# Patient Record
Sex: Male | Born: 1970 | Race: White | Hispanic: No | Marital: Married | Smoking: Never smoker
Health system: Southern US, Community
[De-identification: ages and names within clinical notes are randomized; demographics above are authoritative.]

## PROBLEM LIST (undated history)

## (undated) DIAGNOSIS — E119 Type 2 diabetes mellitus without complications: Secondary | ICD-10-CM

---

## 2008-02-22 ENCOUNTER — Emergency Department (HOSPITAL_COMMUNITY): Admission: EM | Admit: 2008-02-22 | Discharge: 2008-02-22 | Payer: Self-pay | Admitting: Emergency Medicine

## 2011-04-20 ENCOUNTER — Emergency Department (HOSPITAL_BASED_OUTPATIENT_CLINIC_OR_DEPARTMENT_OTHER)
Admission: EM | Admit: 2011-04-20 | Discharge: 2011-04-20 | Disposition: A | Discharge status: Home / Self Care | Payer: BC Managed Care – PPO | Attending: Emergency Medicine | Admitting: Emergency Medicine

## 2011-04-20 ENCOUNTER — Emergency Department (INDEPENDENT_AMBULATORY_CARE_PROVIDER_SITE_OTHER): Payer: BC Managed Care – PPO

## 2011-04-20 DIAGNOSIS — L97509 Non-pressure chronic ulcer of other part of unspecified foot with unspecified severity: Secondary | ICD-10-CM | POA: Insufficient documentation

## 2011-04-20 DIAGNOSIS — M79609 Pain in unspecified limb: Secondary | ICD-10-CM

## 2011-04-20 DIAGNOSIS — E1069 Type 1 diabetes mellitus with other specified complication: Secondary | ICD-10-CM | POA: Insufficient documentation

## 2011-04-20 LAB — BASIC METABOLIC PANEL
CO2: 25 mEq/L (ref 19–32)
Calcium: 9.5 mg/dL (ref 8.4–10.5)
Chloride: 103 mEq/L (ref 96–112)
GFR calc Af Amer: >60 mL/min (ref >60)
Glucose, Bld: 323 mg/dL — ABNORMAL HIGH (ref 70–99)
Potassium: 4.7 mEq/L (ref 3.5–5.1)
Sodium: 142 mEq/L (ref 135–145)

## 2011-04-20 LAB — CBC
HCT: 40 % (ref 39.0–52.0)
Hemoglobin: 14.1 g/dL (ref 13.0–17.0)
MCH: 27.9 pg (ref 26.0–34.0)
MCHC: 35.3 g/dL (ref 30.0–36.0)
MCV: 79.1 fL (ref 78.0–100.0)
RBC: 5.06 MIL/uL (ref 4.22–5.81)

## 2011-04-20 LAB — DIFFERENTIAL
Lymphocytes Relative: 21 % (ref 12–46)
Lymphs Abs: 1.5 10*3/uL (ref 0.7–4.0)
Monocytes Absolute: 0.6 10*3/uL (ref 0.1–1.0)
Monocytes Relative: 8 % (ref 3–12)
Neutro Abs: 4.6 10*3/uL (ref 1.7–7.7)
Neutrophils Relative %: 66 % (ref 43–77)

## 2011-04-20 LAB — GLUCOSE, CAPILLARY: Glucose-Capillary: 368 mg/dL — ABNORMAL HIGH (ref 70–99)

## 2011-09-19 LAB — CBC
HCT: 51.3
Hemoglobin: 17.4 — ABNORMAL HIGH
MCHC: 34
RBC: 6.08 — ABNORMAL HIGH

## 2011-09-19 LAB — I-STAT 8, (EC8 V) (CONVERTED LAB)
Acid-base deficit: 3 — ABNORMAL HIGH
Bicarbonate: 22.5
Chloride: 102
HCT: 55 — ABNORMAL HIGH
Operator id: 277751
Sodium: 135
pCO2, Ven: 39.6 — ABNORMAL LOW

## 2011-09-19 LAB — DIFFERENTIAL
Basophils Relative: 0
Eosinophils Relative: 1
Monocytes Absolute: 0.4
Monocytes Relative: 4
Neutro Abs: 10.2 — ABNORMAL HIGH

## 2011-09-19 LAB — POCT CARDIAC MARKERS
CKMB, poc: <1 — ABNORMAL LOW
Troponin i, poc: <0.05

## 2011-09-19 LAB — POCT I-STAT CREATININE: Creatinine, Ser: 1.3

## 2011-09-19 LAB — URINALYSIS, ROUTINE W REFLEX MICROSCOPIC
Bilirubin Urine: NEGATIVE
Glucose, UA: >1000 — AB
Ketones, ur: >80 — AB
pH: 5.5

## 2011-09-19 LAB — URINE MICROSCOPIC-ADD ON

## 2012-11-21 ENCOUNTER — Emergency Department (HOSPITAL_COMMUNITY): Payer: BC Managed Care – PPO

## 2012-11-21 ENCOUNTER — Emergency Department (HOSPITAL_COMMUNITY)
Admission: EM | Admit: 2012-11-21 | Discharge: 2012-11-21 | Disposition: A | Discharge status: Home / Self Care | Payer: BC Managed Care – PPO | Attending: Emergency Medicine | Admitting: Emergency Medicine

## 2012-11-21 DIAGNOSIS — F411 Generalized anxiety disorder: Secondary | ICD-10-CM | POA: Insufficient documentation

## 2012-11-21 DIAGNOSIS — J3489 Other specified disorders of nose and nasal sinuses: Secondary | ICD-10-CM | POA: Insufficient documentation

## 2012-11-21 DIAGNOSIS — E119 Type 2 diabetes mellitus without complications: Secondary | ICD-10-CM | POA: Insufficient documentation

## 2012-11-21 DIAGNOSIS — R110 Nausea: Secondary | ICD-10-CM | POA: Insufficient documentation

## 2012-11-21 DIAGNOSIS — J069 Acute upper respiratory infection, unspecified: Secondary | ICD-10-CM | POA: Insufficient documentation

## 2012-11-21 DIAGNOSIS — Z794 Long term (current) use of insulin: Secondary | ICD-10-CM | POA: Insufficient documentation

## 2012-11-21 DIAGNOSIS — R509 Fever, unspecified: Secondary | ICD-10-CM | POA: Insufficient documentation

## 2012-11-21 LAB — GLUCOSE, CAPILLARY: Glucose-Capillary: 356 mg/dL — ABNORMAL HIGH (ref 70–99)

## 2012-11-21 MED ORDER — DEXTROMETHORPHAN POLISTIREX 30 MG/5ML PO LQCR
10.0000 mL | Freq: Once | ORAL | Status: DC
  Filled 2012-11-21: qty 10

## 2012-11-21 MED ORDER — ALBUTEROL SULFATE HFA 108 (90 BASE) MCG/ACT IN AERS: 1.0000 | INHALATION_SPRAY | Freq: Four times a day (QID) | RESPIRATORY_TRACT | Status: DC | PRN

## 2012-11-21 MED ORDER — ALBUTEROL SULFATE (5 MG/ML) 0.5% IN NEBU
2.5000 mg | INHALATION_SOLUTION | Freq: Once | RESPIRATORY_TRACT | Status: AC
  Administered 2012-11-21: 2.5 mg via RESPIRATORY_TRACT
  Filled 2012-11-21: qty 0.5

## 2012-11-21 MED ORDER — DEXTROMETHORPHAN HBR 15 MG/5ML PO SYRP: 10.0000 mL | ORAL_SOLUTION | Freq: Four times a day (QID) | ORAL | Status: DC | PRN

## 2012-11-21 NOTE — ED Provider Notes (Signed)
Medical screening examination/treatment/procedure(s) were performed by non-physician practitioner and as supervising physician I was immediately available for consultation/collaboration.  Sunnie Nielsen, MD 11/21/12 [phone]

## 2012-11-21 NOTE — ED Notes (Signed)
POCT CBG 356. RN Sport and exercise psychologist notified

## 2012-11-21 NOTE — ED Notes (Signed)
Pt from home, alert and oriented with c/o shob and having to "think to breath." Patient sts hx of anxiety attacks- sts this feels similar. Patient sts that he is having no chest pain, but has had cough x 1 week.

## 2012-11-21 NOTE — ED Provider Notes (Signed)
History     CSN: [id number]  Arrival date & time 11/21/12  0134   First MD Initiated Contact with Patient 11/21/12 0217      Chief Complaint  Patient presents with  . Shortness of Breath  . Anxiety    (Consider location/radiation/quality/duration/timing/severity/associated sxs/prior treatment) HPI Comments: Patient is a 41 year old male with a past medical history of anxiety who presents with a 5 day history of upper respiratory symptoms including cough and sinus congestion. Patient reports symptoms started gradually and progressively worsened since onset. Patient reports feeling short of breath while falling asleep this evening, which made him nervous. Patient is unsure if the shortness of breath is due to his anxiety/panic attack or is related to his URI symptoms. He has tried robitussin and mucinex at home for symptoms without relief. Patient denies pain. No aggravating/alleviating factors.    No past medical history on file.  No past surgical history on file.  No family history on file.  History  Substance Use Topics  . Smoking status: Not on file  . Smokeless tobacco: Not on file  . Alcohol Use: Not on file      Review of Systems  Constitutional: Positive for fever.  HENT: Positive for congestion and sinus pressure.   Gastrointestinal: Positive for nausea.  All other systems reviewed and are negative.    Allergies  Review of patient's allergies indicates no known allergies.  Home Medications   Current Outpatient Rx  Name  Route  Sig  Dispense  Refill  . ACETAMINOPHEN 500 MG PO TABS   Oral   Take 500 mg by mouth every 6 (six) hours as needed.         . ALPRAZOLAM ER 0.5 MG PO TB24   Oral   Take 0.5 mg by mouth every morning.         . AZITHROMYCIN 1 G PO PACK   Oral   Take 1 packet by mouth once.         Marland Kitchen CETIRIZINE HCL 10 MG PO TABS   Oral   Take 10 mg by mouth daily.         . GUAIFENESIN ER 600 MG PO TB12   Oral   Take 600 mg by  mouth 2 (two) times daily.         . IBUPROFEN 200 MG PO TABS   Oral   Take 400 mg by mouth every 6 (six) hours as needed. PAIN         . INSULIN GLARGINE 100 UNIT/ML [place] SOLN   Subcutaneous   Inject 10 Units into the skin at bedtime.         . INSULIN GLULISINE 100 UNIT/ML [place] SOLN   Subcutaneous   Inject 1-5 Units into the skin 5 (five) times daily.         Marland Kitchen ROSUVASTATIN CALCIUM 20 MG PO TABS   Oral   Take 20 mg by mouth daily.           BP 147/83  Pulse 109  Temp 98.3 F (36.8 C) (Oral)  Resp 22  Ht 5\' 11"  (1.803 m)  Wt 180 lb (81.647 kg)  BMI 25.10 kg/m2  SpO2 98%  Physical Exam  Nursing note and vitals reviewed. Constitutional: He is oriented to person, place, and time. He appears well-developed and well-nourished. No distress.       Patient's voice raspy.  HENT:  Head: Normocephalic and atraumatic.  Eyes: Conjunctivae normal and EOM are  normal. Pupils are equal, round, and reactive to light. No scleral icterus.  Neck: Normal range of motion. Neck supple.  Cardiovascular: Normal rate and regular rhythm.  Exam reveals no gallop and no friction rub.   No murmur heard. Pulmonary/Chest: Effort normal and breath sounds normal. He has no wheezes. He has no rales. He exhibits no tenderness.  Abdominal: Soft. He exhibits no distension. There is no tenderness. There is no rebound.  Musculoskeletal: Normal range of motion.  Neurological: He is alert and oriented to person, place, and time. Coordination normal.       Speech is goal-oriented. Moves limbs without ataxia.   Skin: Skin is warm and dry. He is not diaphoretic.  Psychiatric: He has a normal mood and affect. His behavior is normal.    ED Course  Procedures (including critical care time)  Labs Reviewed  GLUCOSE, CAPILLARY - Abnormal; Notable for the following:    Glucose-Capillary 356 (*)     All other components within normal limits   Dg Chest 2 View  11/21/2012  *RADIOLOGY REPORT*  Clinical  Data: Cough and shortness of breath for 2 days.  CHEST - 2 VIEW  Comparison: None.  Findings: Shallow inspiration. The heart size and pulmonary vascularity are normal. The lungs appear clear and expanded without focal air space disease or consolidation. No blunting of the costophrenic angles.  No pneumothorax.  Mediastinal contours appear intact.  IMPRESSION: No evidence of active pulmonary disease.   Original Report Authenticated By: Burman Nieves, M.D.      1. URI (upper respiratory infection)       MDM  2:43 AM Chest xray pending. Patient will have nebulizer treatment.   3:50 AM Patient feels better after nebulizer treatment. Chest xray unremarkable. Patient BG 356-patient made aware and chooses to treat when he leaves the hospital with his own supplies. No further evaluation needed at this time.       Emilia Beck, PA-C 11/21/12 0401

## 2012-11-21 NOTE — ED Notes (Signed)
Pt states he has been taking medication since Tuesday for fever,  Nausea cold symptoms,  "throwing ketones"   And shortness of breath,  Questionable panic attack

## 2013-03-12 ENCOUNTER — Encounter (HOSPITAL_COMMUNITY): Payer: Self-pay | Admitting: Emergency Medicine

## 2013-03-12 ENCOUNTER — Emergency Department (HOSPITAL_COMMUNITY): Payer: BC Managed Care – PPO

## 2013-03-12 ENCOUNTER — Emergency Department (HOSPITAL_COMMUNITY)
Admission: EM | Admit: 2013-03-12 | Discharge: 2013-03-12 | Disposition: A | Discharge status: Home / Self Care | Payer: BC Managed Care – PPO | Attending: Emergency Medicine | Admitting: Emergency Medicine

## 2013-03-12 DIAGNOSIS — Y939 Activity, unspecified: Secondary | ICD-10-CM | POA: Insufficient documentation

## 2013-03-12 DIAGNOSIS — E119 Type 2 diabetes mellitus without complications: Secondary | ICD-10-CM | POA: Insufficient documentation

## 2013-03-12 DIAGNOSIS — Z794 Long term (current) use of insulin: Secondary | ICD-10-CM | POA: Insufficient documentation

## 2013-03-12 DIAGNOSIS — S2239XA Fracture of one rib, unspecified side, initial encounter for closed fracture: Secondary | ICD-10-CM | POA: Insufficient documentation

## 2013-03-12 DIAGNOSIS — S2232XA Fracture of one rib, left side, initial encounter for closed fracture: Secondary | ICD-10-CM

## 2013-03-12 DIAGNOSIS — R6889 Other general symptoms and signs: Secondary | ICD-10-CM | POA: Insufficient documentation

## 2013-03-12 DIAGNOSIS — Z79899 Other long term (current) drug therapy: Secondary | ICD-10-CM | POA: Insufficient documentation

## 2013-03-12 DIAGNOSIS — W108XXA Fall (on) (from) other stairs and steps, initial encounter: Secondary | ICD-10-CM | POA: Insufficient documentation

## 2013-03-12 DIAGNOSIS — Y929 Unspecified place or not applicable: Secondary | ICD-10-CM | POA: Insufficient documentation

## 2013-03-12 HISTORY — DX: Type 2 diabetes mellitus without complications: E11.9

## 2013-03-12 MED ORDER — OXYCODONE-ACETAMINOPHEN 5-325 MG PO TABS: 1.0000 | ORAL_TABLET | Freq: Four times a day (QID) | ORAL | Status: AC | PRN

## 2013-03-12 NOTE — ED Provider Notes (Signed)
Medical screening examination/treatment/procedure(s) were performed by non-physician practitioner and as supervising physician I was immediately available for consultation/collaboration.  Toy Baker, MD 03/12/13 [phone]

## 2013-03-12 NOTE — ED Notes (Signed)
Pt states he fell down 6-7 steps (hardwood) on Monday, has had progressively worsening since. Now has significant rib and back pain, worse w/ coughing or sneezing. Describes bruising to left buttocks.

## 2013-03-12 NOTE — ED Provider Notes (Signed)
History    This chart was scribed for non-physician practitioner working with Toy Baker, MD by Frederik Pear, ED Scribe. This patient was seen in room WTR5/WTR5 and the patient's care was started at 1838.   CSN: [id number]  Arrival date & time 03/12/13  Joshua Valentine   First MD Initiated Contact with Patient 03/12/13 1838      Chief Complaint  Patient presents with  . Fall  . Rib Injury    (Consider location/radiation/quality/duration/timing/severity/associated sxs/prior treatment) Patient is a 42 y.o. male presenting with chest pain. The history is provided by the patient. No language interpreter was used.  Chest Pain Pain location:  L lateral chest Pain radiates to:  Does not radiate Pain radiates to the back: no   Duration:  2 days Timing:  Constant Progression:  Worsening Associated symptoms: no shortness of breath     Joshua Valentine is a 42 y.o. male who presents to the Emergency Department complaining of sudden onset, 8/10, constant left rib pain that began 2 days ago. He states that he fell down 6-7 hardwood steps 6 days ago and initially had left-sided back and buttock pain that has since resolved, but now complains of gradually worsening rib pain that is aggravated by raising his arm, coughing, and sneezing. He denies any SOB.  Denies cough. Denies fever or chills.  He has taken Ibuprofen for the pain with mild relief.  Past Medical History  Diagnosis Date  . Diabetes mellitus without complication     History reviewed. No pertinent past surgical history.  No family history on file.  History  Substance Use Topics  . Smoking status: Never Smoker   . Smokeless tobacco: Never Used  . Alcohol Use: Yes     Comment: ocassional wine or liquor      Review of Systems  Respiratory: Negative for shortness of breath.   Cardiovascular:       Rib pain  All other systems reviewed and are negative.    Allergies  Review of patient's allergies indicates no known  allergies.  Home Medications   Current Outpatient Rx  Name  Route  Sig  Dispense  Refill  . acetaminophen (TYLENOL) 500 MG tablet   Oral   Take 500 mg by mouth every 6 (six) hours as needed.         . ALPRAZolam (XANAX XR) 0.5 MG 24 hr tablet   Oral   Take 0.5 mg by mouth every morning.         Marland Kitchen ibuprofen (ADVIL,MOTRIN) 200 MG tablet   Oral   Take 400 mg by mouth every 6 (six) hours as needed. PAIN         . insulin glargine (LANTUS) 100 UNIT/ML injection   Subcutaneous   Inject 10 Units into the skin at bedtime.         . insulin glulisine (APIDRA) 100 UNIT/ML injection   Subcutaneous   Inject 1-5 Units into the skin 5 (five) times daily.         . rosuvastatin (CRESTOR) 20 MG tablet   Oral   Take 20 mg by mouth daily.         Marland Kitchen oxyCODONE-acetaminophen (PERCOCET/ROXICET) 5-325 MG per tablet   Oral   Take 1-2 tablets by mouth every 6 (six) hours as needed for pain.   20 tablet   0     BP 129/79  Pulse 92  Temp(Src) 98.1 F (36.7 C) (Oral)  Resp 20  SpO2 97%  Physical Exam  Nursing note and vitals reviewed. Constitutional: He appears well-developed and well-nourished.  HENT:  Head: Normocephalic and atraumatic.  Mouth/Throat: Oropharynx is clear and moist.  Eyes: EOM are normal. Pupils are equal, round, and reactive to light.  Neck: Normal range of motion. Neck supple.  Cardiovascular: Normal rate, regular rhythm and normal heart sounds.   No murmur heard. Good radial pulses.   Pulmonary/Chest: Effort normal and breath sounds normal. No respiratory distress. He has no wheezes. He has no rales. He exhibits tenderness.  Musculoskeletal: Normal range of motion. He exhibits tenderness.  No bruising or tenderness to palpation of the cervical, thoracic, or lumbar spine. Some tenderness to palpation of the left, lower anterior rib. No tenderness to palpation of the left shoulder.  Normal ROM of all extremities  Neurological: He is alert. Gait normal.   Skin: Skin is warm and dry.  Psychiatric: He has a normal mood and affect. His behavior is normal.    ED Course  Procedures (including critical care time)  DIAGNOSTIC STUDIES: Oxygen Saturation is 97% on room air, adequate by my interpretation.    COORDINATION OF CARE:  19:05- Discussed planned course of treatment with the patient, including a left unilateral chest X-ray with ribs, who is agreeable at this time.  Labs Reviewed - No data to display Dg Ribs Unilateral W/chest Left  03/12/2013  *RADIOLOGY REPORT*  Clinical Data: Fall.  Left-sided pain.  LEFT RIBS AND CHEST - 3+ VIEW  Comparison: 11/21/2012  Findings: Heart size is normal.  Mediastinal shadows are normal. The lungs are clear.  No pneumothorax or hemothorax.  Rib films have a marker on the lower left chest tube.  There is a nondisplaced minimally displaced fracture of the left seventh rib posteriorly.  No fractures seen below that.  IMPRESSION: No active cardiopulmonary disease.  Nondisplaced fracture of the left seventh rib posterolaterally.  No pneumothorax or hemothorax.   Original Report Authenticated By: Paulina Fusi, M.D.      1. Rib fracture, left, closed, initial encounter       MDM  Patient presenting with left anterior rib pain that occurred after a fall.  Xray shows nondisplaced rib fracture.  VSS.  Pulse ox 97 on RA.  Patient discharged home with pain medications and incentive spirometry.  I personally performed the services described in this documentation, which was scribed in my presence. The recorded information has been reviewed and is accurate.        Pascal Lux Stilwell, PA-C 03/12/13 2223

## 2013-03-12 NOTE — Discharge Instructions (Signed)
Take pain medication as prescribed for severe pain.  Do not drive or operate heavy machinery while taking medication

## 2013-12-30 ENCOUNTER — Emergency Department (HOSPITAL_BASED_OUTPATIENT_CLINIC_OR_DEPARTMENT_OTHER)
Admission: EM | Admit: 2013-12-30 | Discharge: 2013-12-30 | Disposition: A | Discharge status: Home / Self Care | Payer: BC Managed Care – PPO | Attending: Emergency Medicine | Admitting: Emergency Medicine

## 2013-12-30 ENCOUNTER — Encounter (HOSPITAL_BASED_OUTPATIENT_CLINIC_OR_DEPARTMENT_OTHER): Payer: Self-pay | Admitting: Emergency Medicine

## 2013-12-30 DIAGNOSIS — Z79899 Other long term (current) drug therapy: Secondary | ICD-10-CM | POA: Insufficient documentation

## 2013-12-30 DIAGNOSIS — R233 Spontaneous ecchymoses: Secondary | ICD-10-CM | POA: Insufficient documentation

## 2013-12-30 DIAGNOSIS — M79674 Pain in right toe(s): Secondary | ICD-10-CM

## 2013-12-30 DIAGNOSIS — T148XXA Other injury of unspecified body region, initial encounter: Secondary | ICD-10-CM

## 2013-12-30 DIAGNOSIS — Z794 Long term (current) use of insulin: Secondary | ICD-10-CM | POA: Insufficient documentation

## 2013-12-30 DIAGNOSIS — E119 Type 2 diabetes mellitus without complications: Secondary | ICD-10-CM | POA: Insufficient documentation

## 2013-12-30 NOTE — Discharge Instructions (Signed)
Contusion A contusion is a deep bruise. Contusions are the result of an injury that caused bleeding under the skin. The contusion may turn blue, purple, or yellow. Minor injuries will give you a painless contusion, but more severe contusions may stay painful and swollen for a few weeks.  CAUSES  A contusion is usually caused by a blow, trauma, or direct force to an area of the body. SYMPTOMS   Swelling and redness of the injured area.  Bruising of the injured area.  Tenderness and soreness of the injured area.  Pain. DIAGNOSIS  The diagnosis can be made by taking a history and physical exam. An X-ray, CT scan, or MRI may be needed to determine if there were any associated injuries, such as fractures. TREATMENT  Specific treatment will depend on what area of the body was injured. In general, the best treatment for a contusion is resting, icing, elevating, and applying cold compresses to the injured area. Over-the-counter medicines may also be recommended for pain control. Ask your caregiver what the best treatment is for your contusion. HOME CARE INSTRUCTIONS   Put ice on the injured area.  Put ice in a plastic bag.  Place a towel between your skin and the bag.  Leave the ice on for 15-20 minutes, 03-04 times a day.  Only take over-the-counter or prescription medicines for pain, discomfort, or fever as directed by your caregiver. Your caregiver may recommend avoiding anti-inflammatory medicines (aspirin, ibuprofen, and naproxen) for 48 hours because these medicines may increase bruising.  Rest the injured area.  If possible, elevate the injured area to reduce swelling. SEEK IMMEDIATE MEDICAL CARE IF:   You have increased bruising or swelling.  You have pain that is getting worse.  Your swelling or pain is not relieved with medicines. MAKE SURE YOU:   Understand these instructions.  Will watch your condition.  Will get help right away if you are not doing well or get  worse. Document Released: 09/24/2005 Document Revised: 03/08/2012 Document Reviewed: 10/20/2011 [facility] Patient Information 2014 [place], [place].   You were seen today for bruising to your right 5th toe.  There was no sign of infection on exam but if you notice an open wound, redness, pus, numbness, have a fever, please seek medical attention.

## 2013-12-30 NOTE — ED Notes (Signed)
Per Pt. He is a diabetic insulin dependant.  Pt. Reports noticing yesterday the R 5th toe is purple in color and no reports of injury.  Pt. Reports pain 5/10 in toe.

## 2013-12-30 NOTE — ED Provider Notes (Signed)
TIME SEEN: 3:10 PM  CHIEF COMPLAINT: Right fifth toe pain  HPI: Patient is a 43 year old male with history of insulin-dependent diabetes who presents emergency department with bruising to his right fifth toe and mild pain he noticed yesterday. Denies any history of injury. He reports that he was worried that he could have infection in the foot and could not get in to see his primary care doctor today and 1 to be evaluated. He denies any fevers or chills. No redness, warmth, induration or fluctuance, drainage. No ulcer or open wound. No numbness, tingling or focal weakness.  ROS: See HPI Constitutional: no fever  Eyes: no drainage  ENT: no runny nose   Cardiovascular:  no chest pain  Resp: no SOB  GI: no vomiting GU: no dysuria Integumentary: no rash  Allergy: no hives  Musculoskeletal: no leg swelling  Neurological: no slurred speech ROS otherwise negative  PAST MEDICAL HISTORY/PAST SURGICAL HISTORY:  Past Medical History  Diagnosis Date  . Diabetes mellitus without complication     MEDICATIONS:  Prior to Admission medications   Medication Sig Start Date End Date Taking? Authorizing Provider  acetaminophen (TYLENOL) 500 MG tablet Take 500 mg by mouth every 6 (six) hours as needed.    Historical Provider, MD  ALPRAZolam (XANAX XR) 0.5 MG 24 hr tablet Take 0.5 mg by mouth every morning.    Historical Provider, MD  ibuprofen (ADVIL,MOTRIN) 200 MG tablet Take 400 mg by mouth every 6 (six) hours as needed. PAIN    Historical Provider, MD  insulin glargine (LANTUS) 100 UNIT/ML injection Inject 10 Units into the skin at bedtime.    Historical Provider, MD  insulin glulisine (APIDRA) 100 UNIT/ML injection Inject 1-5 Units into the skin 5 (five) times daily.    Historical Provider, MD  oxyCODONE-acetaminophen (PERCOCET/ROXICET) 5-325 MG per tablet Take 1-2 tablets by mouth every 6 (six) hours as needed for pain. 03/12/13   Heather Laisure, PA-C  rosuvastatin (CRESTOR) 20 MG tablet Take 20 mg  by mouth daily.    Historical Provider, MD    ALLERGIES:  No Known Allergies  SOCIAL HISTORY:  History  Substance Use Topics  . Smoking status: Never Smoker   . Smokeless tobacco: Never Used  . Alcohol Use: Yes     Comment: ocassional wine or liquor    FAMILY HISTORY: No family history on file.  EXAM: BP 131/83  Pulse 97  Temp(Src) 98 F (36.7 C) (Oral)  Resp 18  Ht 5\' 11"  (1.803 m)  Wt 180 lb (81.647 kg)  BMI 25.12 kg/m2  SpO2 100% CONSTITUTIONAL: Alert and oriented and responds appropriately to questions. Well-appearing; well-nourished HEAD: Normocephalic EYES: Conjunctivae clear, PERRL ENT: normal nose; no rhinorrhea; moist mucous membranes; pharynx without lesions noted NECK: Supple, no meningismus, no LAD  CARD: RRR; S1 and S2 appreciated; no murmurs, no clicks, no rubs, no gallops RESP: Normal chest excursion without splinting or tachypnea; breath sounds clear and equal bilaterally; no wheezes, no rhonchi, no rales,  ABD/GI: Normal bowel sounds; non-distended; soft, non-tender, no rebound, no guarding BACK:  The back appears normal and is non-tender to palpation, there is no CVA tenderness EXT: Ecchymosis to the right fifth toe, no bony tenderness, no joint effusion on exam, 2+ DP pulses bilaterally, no open wounds to the foot, sensation to light touch intact diffusely in bilateral lower extremities, Normal ROM in all joints; non-tender to palpation; no edema; normal capillary refill; no cyanosis    SKIN: Normal color for age and race;  warm, minimal amount of ecchymosis to the medial aspect of the right fifth toe, no induration or warmth or redness or swelling NEURO: Moves all extremities equally PSYCH: The patient's mood and manner are appropriate. Grooming and personal hygiene are appropriate.  MEDICAL DECISION MAKING: Patient with ecchymosis to his right fifth toe. Suspect contusion. He denies wanting an x-ray today. He states he is able to ambulate without  difficulty. He is here because he was worried he may have infection. There is no sign of infection on exam. No redness, warmth, induration, fluctuance, drainage, joint effusion. No open wounds. No subcutaneous gas. He is neurovascularly intact distally. He states his blood sugars have been running in the 200s recently. Have given strict return precautions. Patient verbalizes understanding and is comfortable plan. Has PCP followup.      Layla MawKristen N Boomer Winders, DO 12/30/13 [phone]

## 2014-08-04 IMAGING — CR DG RIBS W/ CHEST 3+V*L*
4 series · 4 of 4 positions shown · non-contrast
Comparison: 11/21/2012

CLINICAL DATA: Fall.  Left-sided pain.

LEFT RIBS AND CHEST - 3+ VIEW

[w chest pa]
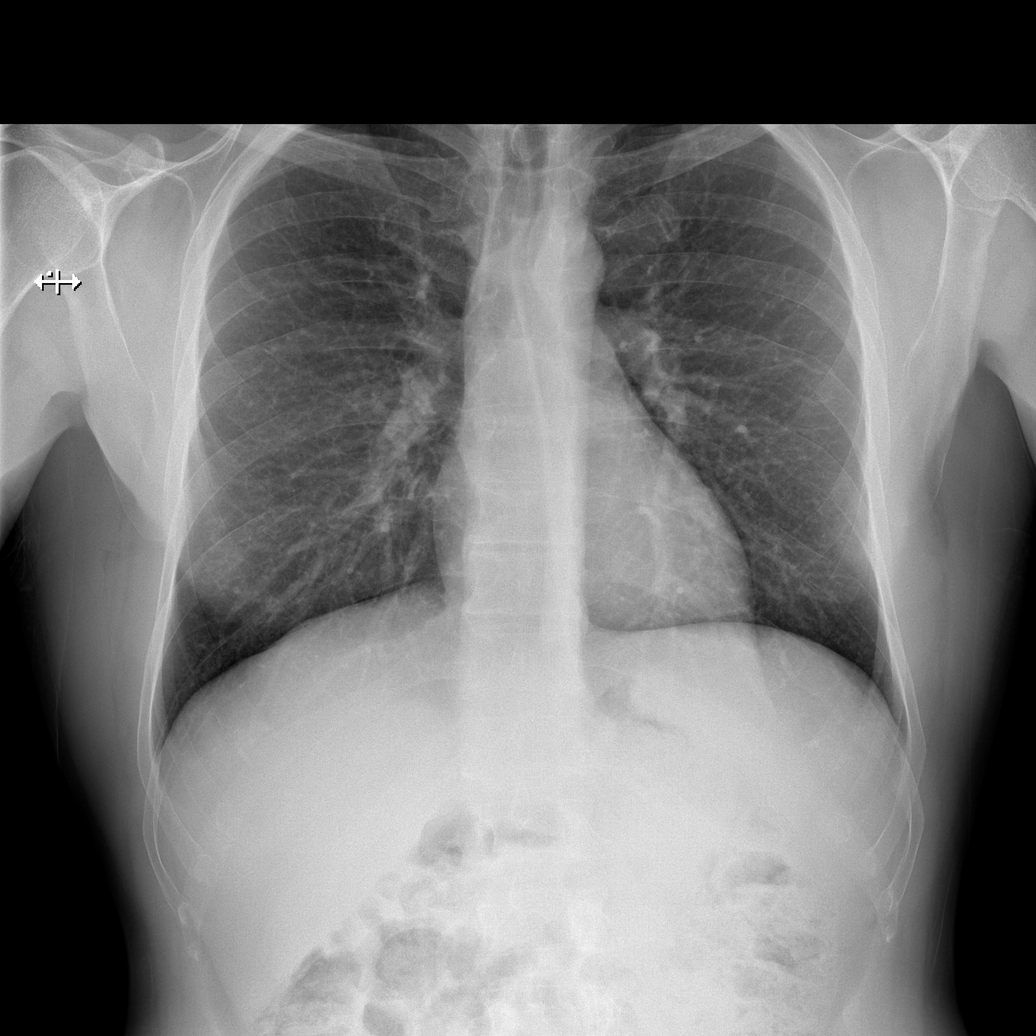

[w ribs ap upper left]
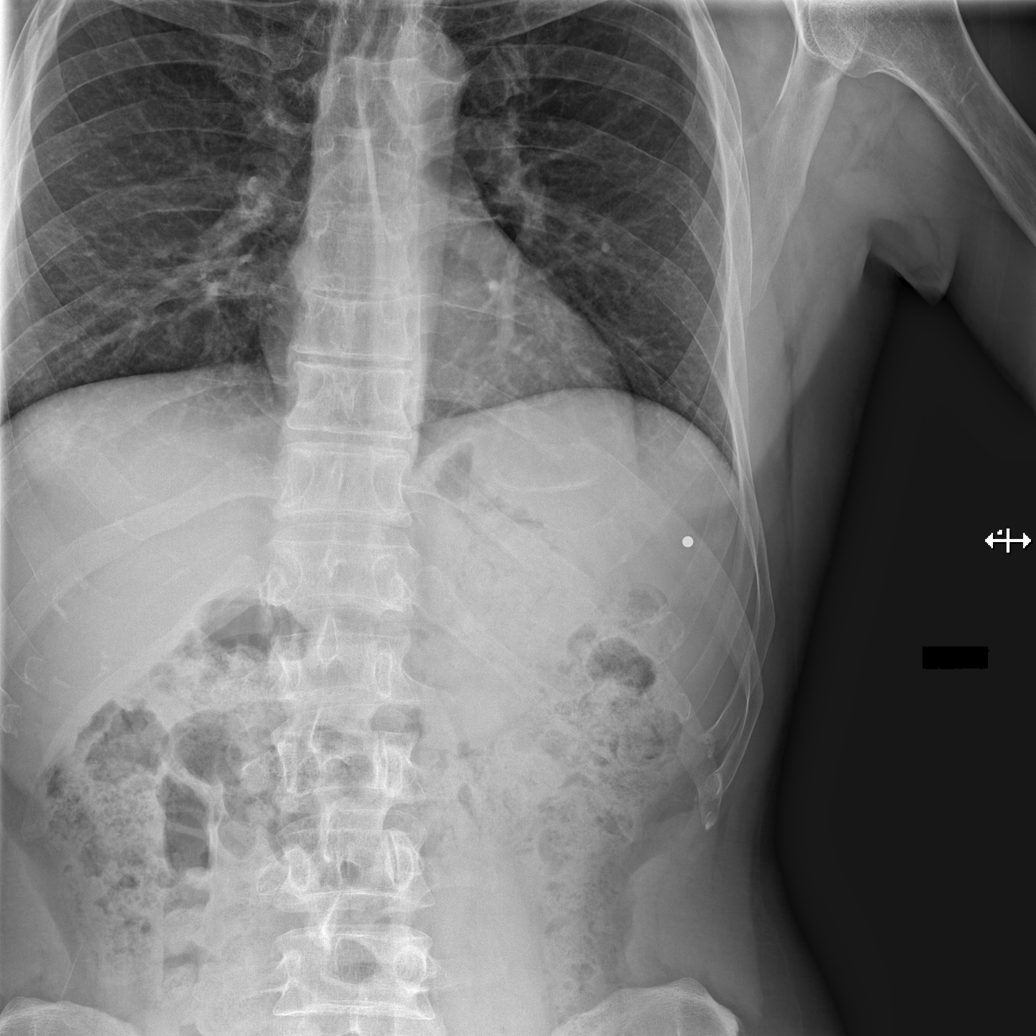

[w ribs ap lower left]
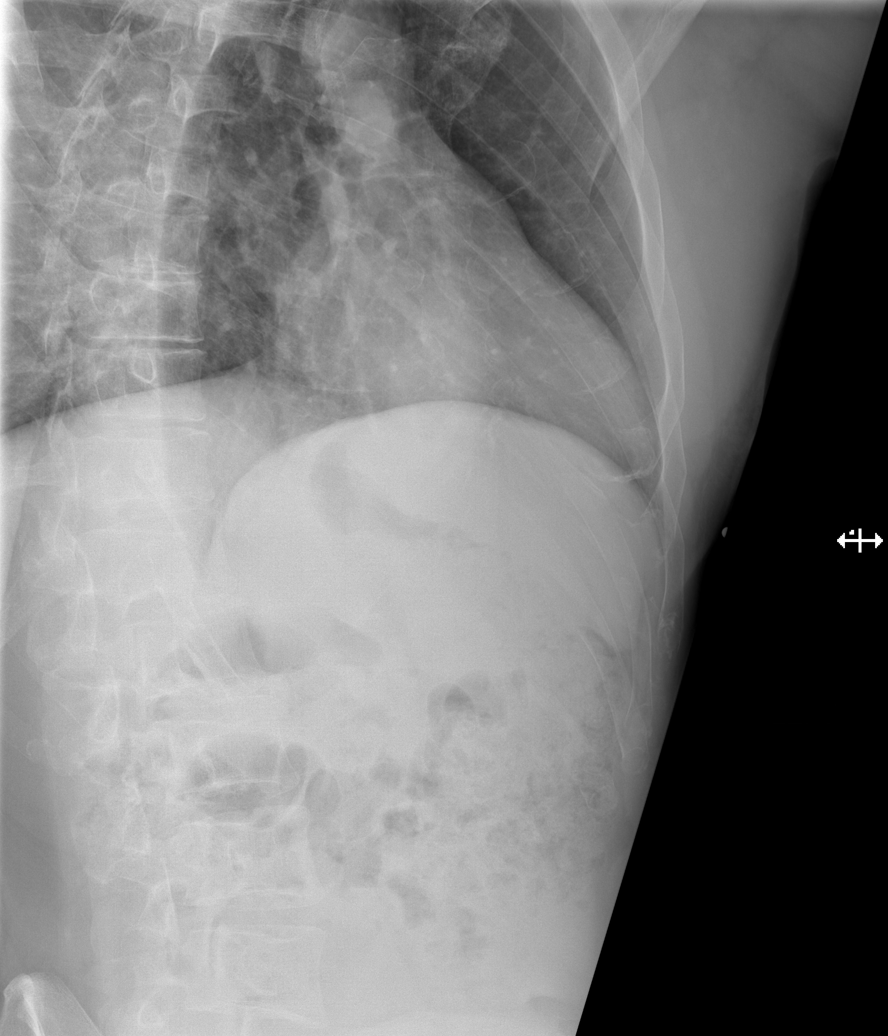

[w ribs obl left]
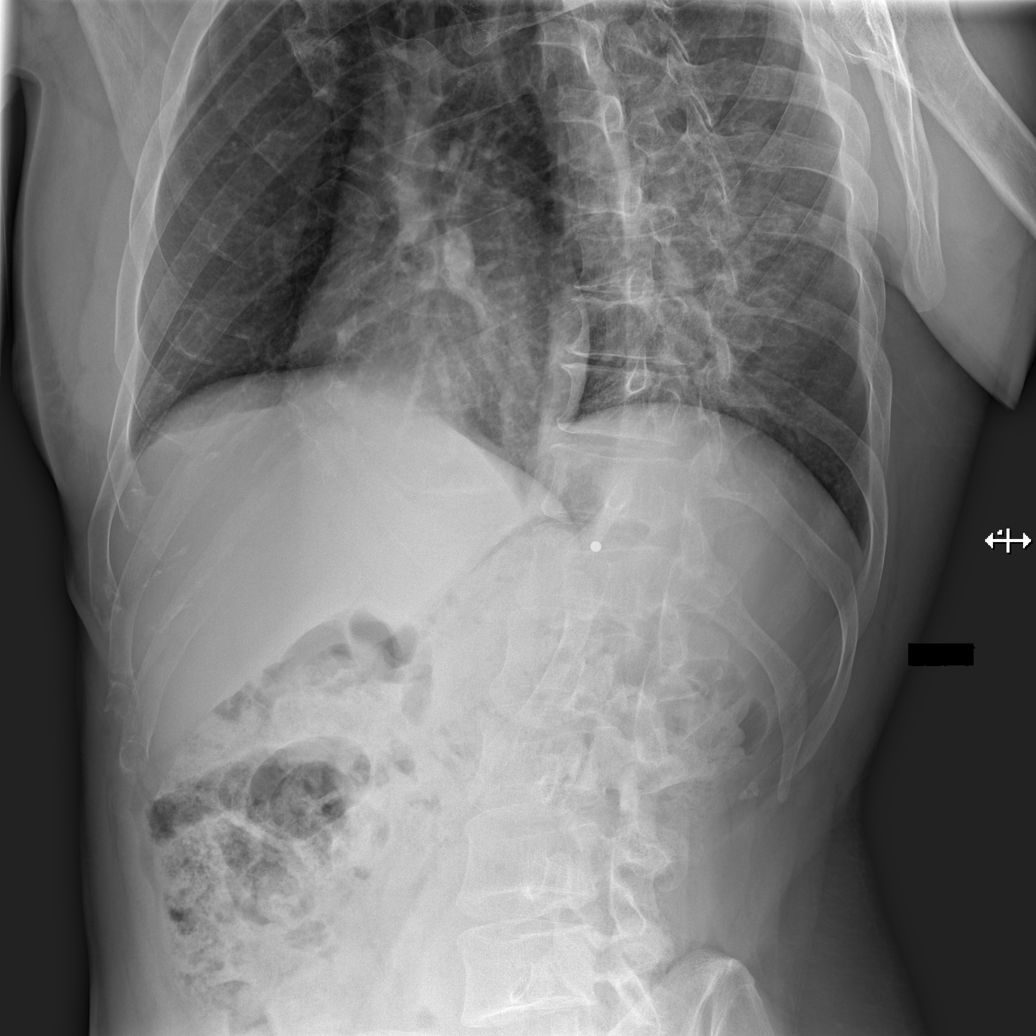

[4 of 4 positions shown; findings below may reference images not displayed]

FINDINGS: Heart size is normal.  Mediastinal shadows are normal.
The lungs are clear.  No pneumothorax or hemothorax.  Rib films
have a marker on the lower left chest tube.  There is a
nondisplaced minimally displaced fracture of the left seventh rib
posteriorly.  No fractures seen below that.
IMPRESSION: No active cardiopulmonary disease.  Nondisplaced fracture of the
left seventh rib posterolaterally.  No pneumothorax or hemothorax.

## 2016-04-17 DIAGNOSIS — E1065 Type 1 diabetes mellitus with hyperglycemia: Secondary | ICD-10-CM | POA: Diagnosis not present

## 2016-04-17 DIAGNOSIS — E784 Other hyperlipidemia: Secondary | ICD-10-CM | POA: Diagnosis not present

## 2016-04-17 DIAGNOSIS — F411 Generalized anxiety disorder: Secondary | ICD-10-CM | POA: Diagnosis not present

## 2016-04-17 DIAGNOSIS — N63 Unspecified lump in breast: Secondary | ICD-10-CM | POA: Diagnosis not present

## 2016-04-28 DIAGNOSIS — R928 Other abnormal and inconclusive findings on diagnostic imaging of breast: Secondary | ICD-10-CM | POA: Diagnosis not present

## 2016-04-30 DIAGNOSIS — E113511 Type 2 diabetes mellitus with proliferative diabetic retinopathy with macular edema, right eye: Secondary | ICD-10-CM | POA: Diagnosis not present

## 2016-05-05 DIAGNOSIS — N62 Hypertrophy of breast: Secondary | ICD-10-CM | POA: Diagnosis not present

## 2016-05-05 DIAGNOSIS — N63 Unspecified lump in breast: Secondary | ICD-10-CM | POA: Diagnosis not present

## 2016-05-09 DIAGNOSIS — E538 Deficiency of other specified B group vitamins: Secondary | ICD-10-CM | POA: Diagnosis not present

## 2016-05-09 DIAGNOSIS — E103591 Type 1 diabetes mellitus with proliferative diabetic retinopathy without macular edema, right eye: Secondary | ICD-10-CM | POA: Diagnosis not present

## 2016-05-09 DIAGNOSIS — E782 Mixed hyperlipidemia: Secondary | ICD-10-CM | POA: Diagnosis not present

## 2016-06-18 DIAGNOSIS — E113492 Type 2 diabetes mellitus with severe nonproliferative diabetic retinopathy without macular edema, left eye: Secondary | ICD-10-CM | POA: Diagnosis not present

## 2016-06-19 DIAGNOSIS — Z0000 Encounter for general adult medical examination without abnormal findings: Secondary | ICD-10-CM | POA: Diagnosis not present

## 2016-06-19 DIAGNOSIS — Z125 Encounter for screening for malignant neoplasm of prostate: Secondary | ICD-10-CM | POA: Diagnosis not present

## 2016-06-19 DIAGNOSIS — E785 Hyperlipidemia, unspecified: Secondary | ICD-10-CM | POA: Diagnosis not present

## 2016-06-19 DIAGNOSIS — F419 Anxiety disorder, unspecified: Secondary | ICD-10-CM | POA: Diagnosis not present

## 2016-06-19 DIAGNOSIS — E1022 Type 1 diabetes mellitus with diabetic chronic kidney disease: Secondary | ICD-10-CM | POA: Diagnosis not present

## 2016-06-19 DIAGNOSIS — E291 Testicular hypofunction: Secondary | ICD-10-CM | POA: Diagnosis not present

## 2016-07-14 DIAGNOSIS — E113511 Type 2 diabetes mellitus with proliferative diabetic retinopathy with macular edema, right eye: Secondary | ICD-10-CM | POA: Diagnosis not present

## 2016-08-06 DIAGNOSIS — E113492 Type 2 diabetes mellitus with severe nonproliferative diabetic retinopathy without macular edema, left eye: Secondary | ICD-10-CM | POA: Diagnosis not present

## 2016-09-09 DIAGNOSIS — F419 Anxiety disorder, unspecified: Secondary | ICD-10-CM | POA: Diagnosis not present

## 2016-09-09 DIAGNOSIS — M545 Low back pain: Secondary | ICD-10-CM | POA: Diagnosis not present

## 2016-09-09 DIAGNOSIS — R072 Precordial pain: Secondary | ICD-10-CM | POA: Diagnosis not present

## 2016-09-09 DIAGNOSIS — E785 Hyperlipidemia, unspecified: Secondary | ICD-10-CM | POA: Diagnosis not present

## 2016-09-09 DIAGNOSIS — R079 Chest pain, unspecified: Secondary | ICD-10-CM | POA: Diagnosis not present

## 2016-09-09 DIAGNOSIS — E119 Type 2 diabetes mellitus without complications: Secondary | ICD-10-CM | POA: Diagnosis not present

## 2016-09-09 DIAGNOSIS — M549 Dorsalgia, unspecified: Secondary | ICD-10-CM | POA: Diagnosis not present

## 2016-09-11 DIAGNOSIS — E113511 Type 2 diabetes mellitus with proliferative diabetic retinopathy with macular edema, right eye: Secondary | ICD-10-CM | POA: Diagnosis not present

## 2016-09-11 DIAGNOSIS — E113492 Type 2 diabetes mellitus with severe nonproliferative diabetic retinopathy without macular edema, left eye: Secondary | ICD-10-CM | POA: Diagnosis not present

## 2016-11-18 DIAGNOSIS — E113511 Type 2 diabetes mellitus with proliferative diabetic retinopathy with macular edema, right eye: Secondary | ICD-10-CM | POA: Diagnosis not present

## 2017-02-18 DIAGNOSIS — E113392 Type 2 diabetes mellitus with moderate nonproliferative diabetic retinopathy without macular edema, left eye: Secondary | ICD-10-CM | POA: Diagnosis not present

## 2017-02-18 DIAGNOSIS — E113511 Type 2 diabetes mellitus with proliferative diabetic retinopathy with macular edema, right eye: Secondary | ICD-10-CM | POA: Diagnosis not present

## 2017-02-24 DIAGNOSIS — J1189 Influenza due to unidentified influenza virus with other manifestations: Secondary | ICD-10-CM | POA: Diagnosis not present

## 2017-03-02 DIAGNOSIS — Z794 Long term (current) use of insulin: Secondary | ICD-10-CM | POA: Diagnosis not present

## 2017-03-02 DIAGNOSIS — E785 Hyperlipidemia, unspecified: Secondary | ICD-10-CM | POA: Diagnosis not present

## 2017-03-02 DIAGNOSIS — Z79899 Other long term (current) drug therapy: Secondary | ICD-10-CM | POA: Diagnosis not present

## 2017-03-02 DIAGNOSIS — S92512A Displaced fracture of proximal phalanx of left lesser toe(s), initial encounter for closed fracture: Secondary | ICD-10-CM | POA: Diagnosis not present

## 2017-03-02 DIAGNOSIS — M79675 Pain in left toe(s): Secondary | ICD-10-CM | POA: Diagnosis not present

## 2017-03-02 DIAGNOSIS — X58XXXA Exposure to other specified factors, initial encounter: Secondary | ICD-10-CM | POA: Diagnosis not present

## 2017-03-02 DIAGNOSIS — E119 Type 2 diabetes mellitus without complications: Secondary | ICD-10-CM | POA: Diagnosis not present

## 2017-03-02 DIAGNOSIS — S92505A Nondisplaced unspecified fracture of left lesser toe(s), initial encounter for closed fracture: Secondary | ICD-10-CM | POA: Diagnosis not present

## 2017-03-02 DIAGNOSIS — S92515A Nondisplaced fracture of proximal phalanx of left lesser toe(s), initial encounter for closed fracture: Secondary | ICD-10-CM | POA: Diagnosis not present

## 2017-03-07 DIAGNOSIS — M7989 Other specified soft tissue disorders: Secondary | ICD-10-CM | POA: Diagnosis not present

## 2017-03-07 DIAGNOSIS — Z794 Long term (current) use of insulin: Secondary | ICD-10-CM | POA: Diagnosis not present

## 2017-03-07 DIAGNOSIS — E860 Dehydration: Secondary | ICD-10-CM | POA: Diagnosis not present

## 2017-03-07 DIAGNOSIS — R112 Nausea with vomiting, unspecified: Secondary | ICD-10-CM | POA: Diagnosis not present

## 2017-03-07 DIAGNOSIS — M79675 Pain in left toe(s): Secondary | ICD-10-CM | POA: Diagnosis not present

## 2017-03-07 DIAGNOSIS — E119 Type 2 diabetes mellitus without complications: Secondary | ICD-10-CM | POA: Diagnosis not present

## 2017-03-07 DIAGNOSIS — R05 Cough: Secondary | ICD-10-CM | POA: Diagnosis not present

## 2017-03-07 DIAGNOSIS — M791 Myalgia: Secondary | ICD-10-CM | POA: Diagnosis not present

## 2017-03-10 DIAGNOSIS — R110 Nausea: Secondary | ICD-10-CM | POA: Diagnosis not present

## 2017-03-10 DIAGNOSIS — R740 Nonspecific elevation of levels of transaminase and lactic acid dehydrogenase [LDH]: Secondary | ICD-10-CM | POA: Diagnosis not present

## 2017-03-10 DIAGNOSIS — E109 Type 1 diabetes mellitus without complications: Secondary | ICD-10-CM | POA: Diagnosis not present

## 2017-03-10 DIAGNOSIS — Z794 Long term (current) use of insulin: Secondary | ICD-10-CM | POA: Diagnosis not present

## 2017-03-10 DIAGNOSIS — R509 Fever, unspecified: Secondary | ICD-10-CM | POA: Diagnosis not present

## 2017-03-10 DIAGNOSIS — R918 Other nonspecific abnormal finding of lung field: Secondary | ICD-10-CM | POA: Diagnosis not present

## 2017-03-10 DIAGNOSIS — L270 Generalized skin eruption due to drugs and medicaments taken internally: Secondary | ICD-10-CM | POA: Diagnosis not present

## 2017-03-10 DIAGNOSIS — D696 Thrombocytopenia, unspecified: Secondary | ICD-10-CM | POA: Diagnosis not present

## 2017-03-10 DIAGNOSIS — D61818 Other pancytopenia: Secondary | ICD-10-CM | POA: Diagnosis not present

## 2017-03-10 DIAGNOSIS — M549 Dorsalgia, unspecified: Secondary | ICD-10-CM | POA: Diagnosis not present

## 2017-03-11 DIAGNOSIS — L270 Generalized skin eruption due to drugs and medicaments taken internally: Secondary | ICD-10-CM | POA: Diagnosis not present

## 2017-03-11 DIAGNOSIS — Z6826 Body mass index (BMI) 26.0-26.9, adult: Secondary | ICD-10-CM | POA: Diagnosis not present

## 2017-03-12 DIAGNOSIS — L270 Generalized skin eruption due to drugs and medicaments taken internally: Secondary | ICD-10-CM | POA: Diagnosis not present

## 2017-03-12 DIAGNOSIS — Z6826 Body mass index (BMI) 26.0-26.9, adult: Secondary | ICD-10-CM | POA: Diagnosis not present

## 2017-03-31 DIAGNOSIS — E538 Deficiency of other specified B group vitamins: Secondary | ICD-10-CM | POA: Diagnosis not present

## 2017-03-31 DIAGNOSIS — E103591 Type 1 diabetes mellitus with proliferative diabetic retinopathy without macular edema, right eye: Secondary | ICD-10-CM | POA: Diagnosis not present

## 2017-03-31 DIAGNOSIS — E559 Vitamin D deficiency, unspecified: Secondary | ICD-10-CM | POA: Diagnosis not present

## 2017-03-31 DIAGNOSIS — E782 Mixed hyperlipidemia: Secondary | ICD-10-CM | POA: Diagnosis not present

## 2017-05-13 DIAGNOSIS — E113392 Type 2 diabetes mellitus with moderate nonproliferative diabetic retinopathy without macular edema, left eye: Secondary | ICD-10-CM | POA: Diagnosis not present

## 2017-05-13 DIAGNOSIS — E113511 Type 2 diabetes mellitus with proliferative diabetic retinopathy with macular edema, right eye: Secondary | ICD-10-CM | POA: Diagnosis not present

## 2017-07-16 DIAGNOSIS — E782 Mixed hyperlipidemia: Secondary | ICD-10-CM | POA: Diagnosis not present

## 2017-07-16 DIAGNOSIS — E559 Vitamin D deficiency, unspecified: Secondary | ICD-10-CM | POA: Diagnosis not present

## 2017-07-16 DIAGNOSIS — E1159 Type 2 diabetes mellitus with other circulatory complications: Secondary | ICD-10-CM | POA: Diagnosis not present

## 2017-07-16 DIAGNOSIS — E039 Hypothyroidism, unspecified: Secondary | ICD-10-CM | POA: Diagnosis not present

## 2017-08-24 DIAGNOSIS — D481 Neoplasm of uncertain behavior of connective and other soft tissue: Secondary | ICD-10-CM | POA: Diagnosis not present

## 2017-09-02 DIAGNOSIS — E113511 Type 2 diabetes mellitus with proliferative diabetic retinopathy with macular edema, right eye: Secondary | ICD-10-CM | POA: Diagnosis not present

## 2017-10-28 DIAGNOSIS — D2111 Benign neoplasm of connective and other soft tissue of right upper limb, including shoulder: Secondary | ICD-10-CM | POA: Diagnosis not present

## 2017-10-28 DIAGNOSIS — D179 Benign lipomatous neoplasm, unspecified: Secondary | ICD-10-CM | POA: Diagnosis not present

## 2017-10-29 DIAGNOSIS — E1022 Type 1 diabetes mellitus with diabetic chronic kidney disease: Secondary | ICD-10-CM | POA: Diagnosis not present

## 2017-10-29 DIAGNOSIS — E785 Hyperlipidemia, unspecified: Secondary | ICD-10-CM | POA: Diagnosis not present

## 2017-10-29 DIAGNOSIS — E875 Hyperkalemia: Secondary | ICD-10-CM | POA: Diagnosis not present

## 2017-10-29 DIAGNOSIS — F419 Anxiety disorder, unspecified: Secondary | ICD-10-CM | POA: Diagnosis not present

## 2017-10-29 DIAGNOSIS — E039 Hypothyroidism, unspecified: Secondary | ICD-10-CM | POA: Diagnosis not present

## 2017-11-05 DIAGNOSIS — L7632 Postprocedural hematoma of skin and subcutaneous tissue following other procedure: Secondary | ICD-10-CM | POA: Diagnosis not present

## 2017-12-02 DIAGNOSIS — E113511 Type 2 diabetes mellitus with proliferative diabetic retinopathy with macular edema, right eye: Secondary | ICD-10-CM | POA: Diagnosis not present

## 2017-12-02 DIAGNOSIS — E113392 Type 2 diabetes mellitus with moderate nonproliferative diabetic retinopathy without macular edema, left eye: Secondary | ICD-10-CM | POA: Diagnosis not present

## 2017-12-12 DIAGNOSIS — R0781 Pleurodynia: Secondary | ICD-10-CM | POA: Diagnosis not present

## 2017-12-12 DIAGNOSIS — Z6825 Body mass index (BMI) 25.0-25.9, adult: Secondary | ICD-10-CM | POA: Diagnosis not present

## 2017-12-12 DIAGNOSIS — S29009A Unspecified injury of muscle and tendon of unspecified wall of thorax, initial encounter: Secondary | ICD-10-CM | POA: Diagnosis not present

## 2017-12-12 DIAGNOSIS — E109 Type 1 diabetes mellitus without complications: Secondary | ICD-10-CM | POA: Diagnosis not present

## 2017-12-12 DIAGNOSIS — W108XXA Fall (on) (from) other stairs and steps, initial encounter: Secondary | ICD-10-CM | POA: Diagnosis not present

## 2017-12-12 DIAGNOSIS — W109XXA Fall (on) (from) unspecified stairs and steps, initial encounter: Secondary | ICD-10-CM | POA: Diagnosis not present

## 2017-12-16 DIAGNOSIS — S9031XA Contusion of right foot, initial encounter: Secondary | ICD-10-CM | POA: Diagnosis not present

## 2017-12-16 DIAGNOSIS — S20212A Contusion of left front wall of thorax, initial encounter: Secondary | ICD-10-CM | POA: Diagnosis not present

## 2018-01-11 DIAGNOSIS — E1065 Type 1 diabetes mellitus with hyperglycemia: Secondary | ICD-10-CM | POA: Diagnosis not present

## 2018-01-11 DIAGNOSIS — E039 Hypothyroidism, unspecified: Secondary | ICD-10-CM | POA: Diagnosis not present

## 2018-01-11 DIAGNOSIS — E782 Mixed hyperlipidemia: Secondary | ICD-10-CM | POA: Diagnosis not present

## 2018-01-11 DIAGNOSIS — E103599 Type 1 diabetes mellitus with proliferative diabetic retinopathy without macular edema, unspecified eye: Secondary | ICD-10-CM | POA: Diagnosis not present

## 2018-01-11 DIAGNOSIS — E559 Vitamin D deficiency, unspecified: Secondary | ICD-10-CM | POA: Diagnosis not present

## 2018-01-15 DIAGNOSIS — E103519 Type 1 diabetes mellitus with proliferative diabetic retinopathy with macular edema, unspecified eye: Secondary | ICD-10-CM | POA: Diagnosis not present

## 2018-01-15 DIAGNOSIS — E782 Mixed hyperlipidemia: Secondary | ICD-10-CM | POA: Diagnosis not present

## 2018-01-15 DIAGNOSIS — E1065 Type 1 diabetes mellitus with hyperglycemia: Secondary | ICD-10-CM | POA: Diagnosis not present

## 2018-01-15 DIAGNOSIS — E559 Vitamin D deficiency, unspecified: Secondary | ICD-10-CM | POA: Diagnosis not present

## 2018-02-24 DIAGNOSIS — E039 Hypothyroidism, unspecified: Secondary | ICD-10-CM | POA: Diagnosis not present

## 2018-02-24 DIAGNOSIS — E1065 Type 1 diabetes mellitus with hyperglycemia: Secondary | ICD-10-CM | POA: Diagnosis not present

## 2018-02-24 DIAGNOSIS — E103519 Type 1 diabetes mellitus with proliferative diabetic retinopathy with macular edema, unspecified eye: Secondary | ICD-10-CM | POA: Diagnosis not present

## 2018-04-08 DIAGNOSIS — E1065 Type 1 diabetes mellitus with hyperglycemia: Secondary | ICD-10-CM | POA: Diagnosis not present

## 2018-04-08 DIAGNOSIS — E039 Hypothyroidism, unspecified: Secondary | ICD-10-CM | POA: Diagnosis not present

## 2018-04-08 DIAGNOSIS — E782 Mixed hyperlipidemia: Secondary | ICD-10-CM | POA: Diagnosis not present

## 2018-04-08 DIAGNOSIS — E103519 Type 1 diabetes mellitus with proliferative diabetic retinopathy with macular edema, unspecified eye: Secondary | ICD-10-CM | POA: Diagnosis not present

## 2018-05-12 DIAGNOSIS — E113312 Type 2 diabetes mellitus with moderate nonproliferative diabetic retinopathy with macular edema, left eye: Secondary | ICD-10-CM | POA: Diagnosis not present

## 2018-05-12 DIAGNOSIS — E113511 Type 2 diabetes mellitus with proliferative diabetic retinopathy with macular edema, right eye: Secondary | ICD-10-CM | POA: Diagnosis not present

## 2018-07-12 DIAGNOSIS — E103519 Type 1 diabetes mellitus with proliferative diabetic retinopathy with macular edema, unspecified eye: Secondary | ICD-10-CM | POA: Diagnosis not present

## 2018-08-11 DIAGNOSIS — E11649 Type 2 diabetes mellitus with hypoglycemia without coma: Secondary | ICD-10-CM | POA: Diagnosis not present

## 2018-09-01 DIAGNOSIS — E113512 Type 2 diabetes mellitus with proliferative diabetic retinopathy with macular edema, left eye: Secondary | ICD-10-CM | POA: Diagnosis not present

## 2018-09-01 DIAGNOSIS — E113511 Type 2 diabetes mellitus with proliferative diabetic retinopathy with macular edema, right eye: Secondary | ICD-10-CM | POA: Diagnosis not present

## 2018-09-29 DIAGNOSIS — E103519 Type 1 diabetes mellitus with proliferative diabetic retinopathy with macular edema, unspecified eye: Secondary | ICD-10-CM | POA: Diagnosis not present

## 2018-11-10 DIAGNOSIS — E113512 Type 2 diabetes mellitus with proliferative diabetic retinopathy with macular edema, left eye: Secondary | ICD-10-CM | POA: Diagnosis not present

## 2018-11-16 DIAGNOSIS — E538 Deficiency of other specified B group vitamins: Secondary | ICD-10-CM | POA: Diagnosis not present

## 2018-11-16 DIAGNOSIS — E039 Hypothyroidism, unspecified: Secondary | ICD-10-CM | POA: Diagnosis not present

## 2018-11-16 DIAGNOSIS — E103519 Type 1 diabetes mellitus with proliferative diabetic retinopathy with macular edema, unspecified eye: Secondary | ICD-10-CM | POA: Diagnosis not present

## 2018-11-16 DIAGNOSIS — E1065 Type 1 diabetes mellitus with hyperglycemia: Secondary | ICD-10-CM | POA: Diagnosis not present

## 2018-11-16 DIAGNOSIS — E559 Vitamin D deficiency, unspecified: Secondary | ICD-10-CM | POA: Diagnosis not present

## 2018-12-28 DIAGNOSIS — E103519 Type 1 diabetes mellitus with proliferative diabetic retinopathy with macular edema, unspecified eye: Secondary | ICD-10-CM | POA: Diagnosis not present
